# Patient Record
Sex: Female | Born: 1970 | Race: Black or African American | Hispanic: No | Marital: Single | Smoking: Never smoker
Health system: Southern US, Community
[De-identification: ages and names within clinical notes are randomized; demographics above are authoritative.]

## PROBLEM LIST (undated history)

## (undated) DIAGNOSIS — Q891 Congenital malformations of adrenal gland: Secondary | ICD-10-CM

## (undated) DIAGNOSIS — K279 Peptic ulcer, site unspecified, unspecified as acute or chronic, without hemorrhage or perforation: Secondary | ICD-10-CM

## (undated) HISTORY — PX: VAGINA RECONSTRUCTION SURGERY: SHX828

## (undated) HISTORY — PX: TONSILLECTOMY: SUR1361

---

## 1998-09-14 ENCOUNTER — Emergency Department (HOSPITAL_COMMUNITY): Admission: EM | Admit: 1998-09-14 | Discharge: 1998-09-14 | Payer: Self-pay | Admitting: Emergency Medicine

## 2008-08-19 ENCOUNTER — Emergency Department (HOSPITAL_BASED_OUTPATIENT_CLINIC_OR_DEPARTMENT_OTHER): Admission: EM | Admit: 2008-08-19 | Discharge: 2008-08-19 | Payer: Self-pay | Admitting: Emergency Medicine

## 2009-08-01 ENCOUNTER — Emergency Department (HOSPITAL_COMMUNITY): Admission: EM | Admit: 2009-08-01 | Discharge: 2009-08-01 | Payer: Self-pay | Admitting: Emergency Medicine

## 2010-08-07 LAB — DIFFERENTIAL
Basophils Absolute: 0 10*3/uL (ref 0.0–0.1)
Basophils Relative: 0 % (ref 0–1)

## 2010-08-07 LAB — CBC
HCT: 52.9 % — ABNORMAL HIGH (ref 36.0–46.0)
Hemoglobin: 17.7 g/dL — ABNORMAL HIGH (ref 12.0–15.0)
MCHC: 33.5 g/dL (ref 30.0–36.0)
MCV: 91.8 fL (ref 78.0–100.0)
RBC: 5.76 MIL/uL — ABNORMAL HIGH (ref 3.87–5.11)
RDW: 12.9 % (ref 11.5–15.5)
WBC: 11.3 10*3/uL — ABNORMAL HIGH (ref 4.0–10.5)

## 2010-08-07 LAB — URINALYSIS, ROUTINE W REFLEX MICROSCOPIC
Ketones, ur: 15 mg/dL — AB
Protein, ur: 100 mg/dL — AB

## 2010-08-07 LAB — POCT I-STAT, CHEM 8
BUN: 15 mg/dL (ref 6–23)
Calcium, Ion: 1.16 mmol/L (ref 1.12–1.32)
Chloride: 101 mEq/L (ref 96–112)
Creatinine, Ser: 1.9 mg/dL — ABNORMAL HIGH (ref 0.4–1.2)
Hemoglobin: 19 g/dL — ABNORMAL HIGH (ref 12.0–15.0)
TCO2: 25 mmol/L (ref 0–100)

## 2010-08-07 LAB — URINE CULTURE: Colony Count: >=100000

## 2010-08-07 LAB — URINE MICROSCOPIC-ADD ON

## 2010-08-23 LAB — URINE MICROSCOPIC-ADD ON

## 2010-08-23 LAB — URINALYSIS, ROUTINE W REFLEX MICROSCOPIC
Glucose, UA: NEGATIVE mg/dL
Hgb urine dipstick: NEGATIVE
Protein, ur: 30 mg/dL — AB

## 2010-08-23 LAB — CBC
Hemoglobin: 16.9 g/dL — ABNORMAL HIGH (ref 12.0–15.0)
RDW: 12.9 % (ref 11.5–15.5)

## 2010-08-23 LAB — DIFFERENTIAL
Monocytes Relative: 13 % — ABNORMAL HIGH (ref 3–12)
Neutro Abs: 2.8 10*3/uL (ref 1.7–7.7)
Neutrophils Relative %: 49 % (ref 43–77)

## 2010-08-23 LAB — COMPREHENSIVE METABOLIC PANEL
Albumin: 4.4 g/dL (ref 3.5–5.2)
Alkaline Phosphatase: 48 U/L (ref 39–117)
BUN: 14 mg/dL (ref 6–23)
CO2: 28 mEq/L (ref 19–32)
Calcium: 9.3 mg/dL (ref 8.4–10.5)
GFR calc non Af Amer: 57 mL/min — ABNORMAL LOW (ref >60)
Potassium: 3.8 mEq/L (ref 3.5–5.1)
Total Bilirubin: 0.7 mg/dL (ref 0.3–1.2)

## 2010-08-23 LAB — LIPASE, BLOOD: Lipase: 186 U/L (ref 23–300)

## 2013-08-13 ENCOUNTER — Emergency Department (HOSPITAL_BASED_OUTPATIENT_CLINIC_OR_DEPARTMENT_OTHER)
Admission: EM | Admit: 2013-08-13 | Discharge: 2013-08-13 | Disposition: A | Discharge status: Home / Self Care | Payer: Self-pay | Attending: Emergency Medicine | Admitting: Emergency Medicine

## 2013-08-13 ENCOUNTER — Encounter (HOSPITAL_BASED_OUTPATIENT_CLINIC_OR_DEPARTMENT_OTHER): Payer: Self-pay | Admitting: Emergency Medicine

## 2013-08-13 ENCOUNTER — Emergency Department (HOSPITAL_BASED_OUTPATIENT_CLINIC_OR_DEPARTMENT_OTHER): Payer: Self-pay

## 2013-08-13 DIAGNOSIS — Z79899 Other long term (current) drug therapy: Secondary | ICD-10-CM | POA: Insufficient documentation

## 2013-08-13 DIAGNOSIS — Q891 Congenital malformations of adrenal gland: Secondary | ICD-10-CM | POA: Insufficient documentation

## 2013-08-13 DIAGNOSIS — R05 Cough: Secondary | ICD-10-CM

## 2013-08-13 DIAGNOSIS — L538 Other specified erythematous conditions: Secondary | ICD-10-CM | POA: Insufficient documentation

## 2013-08-13 DIAGNOSIS — R197 Diarrhea, unspecified: Secondary | ICD-10-CM | POA: Insufficient documentation

## 2013-08-13 DIAGNOSIS — R059 Cough, unspecified: Secondary | ICD-10-CM | POA: Insufficient documentation

## 2013-08-13 DIAGNOSIS — R1110 Vomiting, unspecified: Secondary | ICD-10-CM | POA: Insufficient documentation

## 2013-08-13 DIAGNOSIS — R079 Chest pain, unspecified: Secondary | ICD-10-CM | POA: Insufficient documentation

## 2013-08-13 HISTORY — DX: Congenital malformations of adrenal gland: Q89.1

## 2013-08-13 LAB — CBC WITH DIFFERENTIAL/PLATELET
Basophils Absolute: 0 10*3/uL (ref 0.0–0.1)
Basophils Relative: 0 % (ref 0–1)
Eosinophils Absolute: 0.3 10*3/uL (ref 0.0–0.7)
Eosinophils Relative: 5 % (ref 0–5)
HEMATOCRIT: 48.2 % — AB (ref 36.0–46.0)
Hemoglobin: 17 g/dL — ABNORMAL HIGH (ref 12.0–15.0)
LYMPHS ABS: 2.5 10*3/uL (ref 0.7–4.0)
LYMPHS PCT: 37 % (ref 12–46)
MCH: 30.7 pg (ref 26.0–34.0)
MCHC: 35.3 g/dL (ref 30.0–36.0)
MCV: 87.2 fL (ref 78.0–100.0)
MONO ABS: 0.7 10*3/uL (ref 0.1–1.0)
Monocytes Relative: 11 % (ref 3–12)
NEUTROS ABS: 3.2 10*3/uL (ref 1.7–7.7)
NEUTROS PCT: 48 % (ref 43–77)
PLATELETS: 231 10*3/uL (ref 150–400)
RBC: 5.53 MIL/uL — ABNORMAL HIGH (ref 3.87–5.11)
RDW: 13.1 % (ref 11.5–15.5)
WBC: 6.7 10*3/uL (ref 4.0–10.5)

## 2013-08-13 LAB — BASIC METABOLIC PANEL
BUN: 9 mg/dL (ref 6–23)
CALCIUM: 9.9 mg/dL (ref 8.4–10.5)
CHLORIDE: 103 meq/L (ref 96–112)
CO2: 27 meq/L (ref 19–32)
CREATININE: 1.1 mg/dL (ref 0.50–1.10)
GFR calc Af Amer: 72 mL/min — ABNORMAL LOW (ref >90)
GFR calc non Af Amer: 62 mL/min — ABNORMAL LOW (ref >90)
GLUCOSE: 86 mg/dL (ref 70–99)
Potassium: 4.4 mEq/L (ref 3.7–5.3)
Sodium: 143 mEq/L (ref 137–147)

## 2013-08-13 LAB — TROPONIN I: Troponin I: <0.3 ng/mL (ref <0.30)

## 2013-08-13 MED ORDER — HYDROCODONE-HOMATROPINE 5-1.5 MG/5ML PO SYRP: 5.0000 mL | ORAL_SOLUTION | Freq: Four times a day (QID) | ORAL | Status: DC | PRN

## 2013-08-13 NOTE — ED Notes (Signed)
Pt reports onset of cold symptoms 3 days ago, diarrhea and vomiting on Tuesday.  States she has constant sharp left chest wall pain x 2 weeks.

## 2013-08-13 NOTE — Discharge Instructions (Signed)

## 2013-08-13 NOTE — ED Provider Notes (Signed)
CSN: [id number]     Arrival date & time 08/13/13  1137 History   First MD Initiated Contact with Patient 08/13/13 1204     Chief Complaint  Patient presents with  . URI     (Consider location/radiation/quality/duration/timing/severity/associated sxs/prior Treatment) HPI Comments: Pt states that she has had left sided cp without radiation. Symptoms worse with coughing. They have been going on for 2 weeks. Diarrhea and vomiting for 2 days  Patient is a 43 y.o. female presenting with URI. The history is provided by the patient. No language interpreter was used.  URI Presenting symptoms: congestion and cough   Presenting symptoms: no fever   Severity:  Moderate Onset quality:  Gradual Progression:  Unchanged Relieved by:  Nothing Worsened by:  Nothing tried Ineffective treatments:  None tried   Past Medical History  Diagnosis Date  . Congenital adrenal hypoplasia syndrome    Past Surgical History  Procedure Laterality Date  . Vagina reconstruction surgery    . Tonsillectomy     No family history on file. History  Substance Use Topics  . Smoking status: Never Smoker   . Smokeless tobacco: Not on file  . Alcohol Use: No   OB History   Grav Para Term Preterm Abortions TAB SAB Ect Mult Living                 Review of Systems  Constitutional: Negative for fever.  HENT: Positive for congestion.   Respiratory: Positive for cough.       Allergies  Sulfa antibiotics  Home Medications   Current Outpatient Rx  Name  Route  Sig  Dispense  Refill  . Cranberry 1000 MG CAPS   Oral   Take by mouth.         . Cyanocobalamin (VITAMIN B 12 PO)   Oral   Take by mouth.         . Multiple Vitamin (MULTIVITAMIN) capsule   Oral   Take 1 capsule by mouth daily.         . predniSONE (STERAPRED UNI-PAK) 5 MG TABS tablet   Oral   Take by mouth 2 (two) times daily.         . vitamin C (ASCORBIC ACID) 250 MG tablet   Oral   Take 250 mg by mouth daily.         Marland Kitchen.  VITAMIN D, ERGOCALCIFEROL, PO   Oral   Take by mouth.          BP 144/88  Pulse 110  Temp(Src) 98.5 F (36.9 C) (Oral)  Resp 18  Ht 5\' 3"  (1.6 m)  Wt 219 lb (99.338 kg)  BMI 38.80 kg/m2  SpO2 94% Physical Exam  Nursing note and vitals reviewed. Constitutional: She is oriented to person, place, and time. She appears well-developed and well-nourished.  HENT:  Head: Normocephalic and atraumatic.  Right Ear: External ear normal.  Left Ear: External ear normal.  Nose: Rhinorrhea present.  Mouth/Throat: Posterior oropharyngeal erythema present.  Cardiovascular: Normal rate and regular rhythm.   Pulmonary/Chest: Effort normal and breath sounds normal.  Abdominal: Soft. Bowel sounds are normal. There is no tenderness.  Musculoskeletal: Normal range of motion.  Neurological: She is alert and oriented to person, place, and time.  Skin: Skin is warm and dry.  Psychiatric: She has a normal mood and affect.    ED Course  Procedures (including critical care time) Labs Review Labs Reviewed  CBC WITH DIFFERENTIAL - Abnormal; Notable for the following:  RBC 5.53 (*)    Hemoglobin 17.0 (*)    HCT 48.2 (*)    All other components within normal limits  BASIC METABOLIC PANEL - Abnormal; Notable for the following:    GFR calc non Af Amer 62 (*)    GFR calc Af Amer 72 (*)    All other components within normal limits  TROPONIN I   Imaging Review Dg Chest 2 View  08/13/2013   CLINICAL DATA:  Cough and congestion.  EXAM: CHEST  2 VIEW  COMPARISON:  08/01/2009  FINDINGS: The heart size and mediastinal contours are within normal limits. Both lungs are clear. The visualized skeletal structures are unremarkable.  IMPRESSION: No active cardiopulmonary disease.   Electronically Signed   By: Amie Portland M.D.   On: 08/13/2013 12:21     EKG Interpretation   Date/Time:  Thursday August 13 2013 11:57:36 EDT Ventricular Rate:  97 PR Interval:  154 QRS Duration: 72 QT Interval:  320 QTC  Calculation: 406 R Axis:   32 Text Interpretation:  Normal sinus rhythm Low voltage QRS Nonspecific T  wave abnormality Abnormal ECG Confirmed by Fayrene Fearing  MD, MARK ([id number]) on  08/13/2013 12:06:01 PM      MDM   Final diagnoses:  Cough  Chest pain    No infection noted on the chest. Pt is okay to follow up with pcp for continued symptoms. Will given hydromet for pain and cough   Teressa Lower, NP 08/13/13 1404

## 2013-08-14 NOTE — ED Provider Notes (Signed)
Medical screening examination/treatment/procedure(s) were performed by non-physician practitioner and as supervising physician I was immediately available for consultation/collaboration.   EKG Interpretation   Date/Time:  Thursday August 13 2013 11:57:36 EDT Ventricular Rate:  97 PR Interval:  154 QRS Duration: 72 QT Interval:  320 QTC Calculation: 406 R Axis:   32 Text Interpretation:  Normal sinus rhythm Low voltage QRS Nonspecific T  wave abnormality Abnormal ECG Confirmed by Fayrene FearingJAMES  MD, Tyr Franca ([id number]) on  08/13/2013 12:06:01 PM        Rolland PorterMark Atonya Templer, MD 08/14/13 [phone]

## 2014-02-03 ENCOUNTER — Other Ambulatory Visit: Payer: Self-pay

## 2014-02-03 DIAGNOSIS — Z1231 Encounter for screening mammogram for malignant neoplasm of breast: Secondary | ICD-10-CM

## 2014-02-19 ENCOUNTER — Ambulatory Visit: Payer: Self-pay

## 2014-02-23 ENCOUNTER — Ambulatory Visit
Admission: RE | Admit: 2014-02-23 | Discharge: 2014-02-23 | Disposition: A | Discharge status: Home / Self Care | Payer: Self-pay | Source: Ambulatory Visit

## 2014-02-23 ENCOUNTER — Encounter (INDEPENDENT_AMBULATORY_CARE_PROVIDER_SITE_OTHER): Payer: Self-pay

## 2014-02-23 DIAGNOSIS — Z1231 Encounter for screening mammogram for malignant neoplasm of breast: Secondary | ICD-10-CM

## 2015-06-28 ENCOUNTER — Other Ambulatory Visit: Payer: Self-pay

## 2015-06-28 DIAGNOSIS — Z1231 Encounter for screening mammogram for malignant neoplasm of breast: Secondary | ICD-10-CM

## 2015-08-02 ENCOUNTER — Ambulatory Visit: Payer: Self-pay

## 2015-08-23 ENCOUNTER — Ambulatory Visit
Admission: RE | Admit: 2015-08-23 | Discharge: 2015-08-23 | Disposition: A | Discharge status: Home / Self Care | Payer: BLUE CROSS/BLUE SHIELD | Source: Ambulatory Visit

## 2015-08-23 DIAGNOSIS — Z1231 Encounter for screening mammogram for malignant neoplasm of breast: Secondary | ICD-10-CM

## 2015-08-24 ENCOUNTER — Other Ambulatory Visit: Payer: Self-pay | Admitting: Internal Medicine

## 2015-08-24 DIAGNOSIS — N644 Mastodynia: Secondary | ICD-10-CM

## 2015-08-24 DIAGNOSIS — N63 Unspecified lump in unspecified breast: Secondary | ICD-10-CM

## 2015-08-29 ENCOUNTER — Ambulatory Visit
Admission: RE | Admit: 2015-08-29 | Discharge: 2015-08-29 | Disposition: A | Discharge status: Home / Self Care | Payer: BLUE CROSS/BLUE SHIELD | Source: Ambulatory Visit | Attending: Internal Medicine | Admitting: Internal Medicine

## 2015-08-29 ENCOUNTER — Other Ambulatory Visit: Payer: Self-pay | Admitting: Internal Medicine

## 2015-08-29 ENCOUNTER — Ambulatory Visit: Payer: BLUE CROSS/BLUE SHIELD

## 2015-08-29 DIAGNOSIS — N63 Unspecified lump in unspecified breast: Secondary | ICD-10-CM

## 2015-08-29 DIAGNOSIS — N644 Mastodynia: Secondary | ICD-10-CM

## 2015-09-21 ENCOUNTER — Other Ambulatory Visit: Payer: BLUE CROSS/BLUE SHIELD

## 2015-11-09 ENCOUNTER — Encounter (HOSPITAL_BASED_OUTPATIENT_CLINIC_OR_DEPARTMENT_OTHER): Payer: Self-pay | Admitting: *Deleted

## 2015-11-09 ENCOUNTER — Emergency Department (HOSPITAL_BASED_OUTPATIENT_CLINIC_OR_DEPARTMENT_OTHER)
Admission: EM | Admit: 2015-11-09 | Discharge: 2015-11-09 | Disposition: A | Discharge status: Home / Self Care | Payer: BLUE CROSS/BLUE SHIELD | Attending: Emergency Medicine | Admitting: Emergency Medicine

## 2015-11-09 ENCOUNTER — Emergency Department (HOSPITAL_BASED_OUTPATIENT_CLINIC_OR_DEPARTMENT_OTHER): Payer: BLUE CROSS/BLUE SHIELD

## 2015-11-09 DIAGNOSIS — K319 Disease of stomach and duodenum, unspecified: Secondary | ICD-10-CM

## 2015-11-09 DIAGNOSIS — R1013 Epigastric pain: Secondary | ICD-10-CM | POA: Diagnosis present

## 2015-11-09 DIAGNOSIS — N390 Urinary tract infection, site not specified: Secondary | ICD-10-CM

## 2015-11-09 DIAGNOSIS — K30 Functional dyspepsia: Secondary | ICD-10-CM

## 2015-11-09 DIAGNOSIS — K279 Peptic ulcer, site unspecified, unspecified as acute or chronic, without hemorrhage or perforation: Secondary | ICD-10-CM | POA: Diagnosis not present

## 2015-11-09 LAB — CBC WITH DIFFERENTIAL/PLATELET
Basophils Absolute: 0 10*3/uL (ref 0.0–0.1)
Basophils Relative: 0 %
Eosinophils Absolute: 0.2 10*3/uL (ref 0.0–0.7)
Eosinophils Relative: 3 %
HCT: 45.8 % (ref 36.0–46.0)
Hemoglobin: 16.2 g/dL — ABNORMAL HIGH (ref 12.0–15.0)
Lymphocytes Relative: 37 %
Lymphs Abs: 2.5 10*3/uL (ref 0.7–4.0)
MCH: 31 pg (ref 26.0–34.0)
MCHC: 35.4 g/dL (ref 30.0–36.0)
MCV: 87.6 fL (ref 78.0–100.0)
Monocytes Absolute: 0.6 10*3/uL (ref 0.1–1.0)
Monocytes Relative: 8 %
Neutro Abs: 3.5 10*3/uL (ref 1.7–7.7)
Neutrophils Relative %: 52 %
Platelets: 234 10*3/uL (ref 150–400)
RBC: 5.23 MIL/uL — ABNORMAL HIGH (ref 3.87–5.11)
RDW: 13.4 % (ref 11.5–15.5)
WBC: 6.8 10*3/uL (ref 4.0–10.5)

## 2015-11-09 LAB — URINALYSIS, ROUTINE W REFLEX MICROSCOPIC
BILIRUBIN URINE: NEGATIVE
GLUCOSE, UA: NEGATIVE mg/dL
Ketones, ur: NEGATIVE mg/dL
Nitrite: NEGATIVE
Protein, ur: 100 mg/dL — AB
SPECIFIC GRAVITY, URINE: 1.016 (ref 1.005–1.030)
pH: 6 (ref 5.0–8.0)

## 2015-11-09 LAB — URINE MICROSCOPIC-ADD ON

## 2015-11-09 LAB — LIPASE, BLOOD: Lipase: 43 U/L (ref 11–51)

## 2015-11-09 LAB — COMPREHENSIVE METABOLIC PANEL
ALT: 35 U/L (ref 14–54)
AST: 29 U/L (ref 15–41)
Albumin: 3.8 g/dL (ref 3.5–5.0)
Alkaline Phosphatase: 47 U/L (ref 38–126)
Anion gap: 6 (ref 5–15)
BUN: 10 mg/dL (ref 6–20)
CO2: 26 mmol/L (ref 22–32)
Calcium: 9 mg/dL (ref 8.9–10.3)
Chloride: 106 mmol/L (ref 101–111)
Creatinine, Ser: 1.25 mg/dL — ABNORMAL HIGH (ref 0.44–1.00)
GFR calc Af Amer: >60 mL/min (ref >60)
GFR calc non Af Amer: 52 mL/min — ABNORMAL LOW (ref >60)
Glucose, Bld: 108 mg/dL — ABNORMAL HIGH (ref 65–99)
Potassium: 3.6 mmol/L (ref 3.5–5.1)
Sodium: 138 mmol/L (ref 135–145)
Total Bilirubin: 0.6 mg/dL (ref 0.3–1.2)
Total Protein: 6.8 g/dL (ref 6.5–8.1)

## 2015-11-09 LAB — PREGNANCY, URINE: PREG TEST UR: NEGATIVE

## 2015-11-09 MED ORDER — SUCRALFATE 1 G PO TABS: 1.0000 g | ORAL_TABLET | Freq: Three times a day (TID) | ORAL | Status: DC

## 2015-11-09 MED ORDER — GI COCKTAIL ~~LOC~~
30.0000 mL | Freq: Once | ORAL | Status: AC
  Administered 2015-11-09: 30 mL via ORAL
  Filled 2015-11-09: qty 30

## 2015-11-09 MED ORDER — IOPAMIDOL (ISOVUE-300) INJECTION 61%
100.0000 mL | Freq: Once | INTRAVENOUS | Status: AC | PRN
  Administered 2015-11-09: 100 mL via INTRAVENOUS

## 2015-11-09 MED ORDER — NITROFURANTOIN MONOHYD MACRO 100 MG PO CAPS: 100.0000 mg | ORAL_CAPSULE | Freq: Two times a day (BID) | ORAL | Status: DC

## 2015-11-09 MED FILL — SUCRALFATE 1 GM TABLET: 1 | 22 days supply | Qty: 90 | Fill #0

## 2015-11-09 MED FILL — NITROFURANTOIN MONO-MCR 100: 100 | 5 days supply | Qty: 10 | Fill #0

## 2015-11-09 NOTE — ED Notes (Signed)
Pt states she has had abd pain since March, but it has gradually gotten worse. +diarrhea last pm

## 2015-11-09 NOTE — ED Provider Notes (Signed)
CSN: [id number]     Arrival date & time 11/09/15  0914 History   First MD Initiated Contact with Patient 11/09/15 [phone]     Chief Complaint  Patient presents with  . Abdominal Pain    HPI   45 year old female presents today with complaints of abdominal pain. Patient reports symptoms started approximately 4 years ago. She reports a sharp burning pain in her epigastric region. She reports the pain is worse after food, but has become present even without food. She reports a history of acid reflux, slightly worsened recently. Patient denies any fever, weight loss, nausea, vomiting, lower abdominal pain, dark or tarry stools, diarrhea. Patient reports increased urinary frequency. No history of abdominal surgeries.   Past Medical History  Diagnosis Date  . Congenital adrenal hypoplasia syndrome    Past Surgical History  Procedure Laterality Date  . Vagina reconstruction surgery    . Tonsillectomy     No family history on file. Social History  Substance Use Topics  . Smoking status: Never Smoker   . Smokeless tobacco: None  . Alcohol Use: No   OB History    No data available     Review of Systems  All other systems reviewed and are negative.     Allergies  Sulfa antibiotics  Home Medications   Prior to Admission medications   Medication Sig Start Date End Date Taking? Authorizing Provider  Cranberry 1000 MG CAPS Take by mouth.    Historical Provider, MD  Cyanocobalamin (VITAMIN B 12 PO) Take by mouth.    Historical Provider, MD  HYDROcodone-homatropine (HYDROMET) 5-1.5 MG/5ML syrup Take 5 mLs by mouth every 6 (six) hours as needed for cough. 08/13/13   Teressa Lower, NP  Multiple Vitamin (MULTIVITAMIN) capsule Take 1 capsule by mouth daily.    Historical Provider, MD  nitrofurantoin, macrocrystal-monohydrate, (MACROBID) 100 MG capsule Take 1 capsule (100 mg total) by mouth 2 (two) times daily. 11/09/15   Eyvonne Mechanic, PA-C  predniSONE (STERAPRED UNI-PAK) 5 MG TABS tablet  Take by mouth 2 (two) times daily.    Historical Provider, MD  sucralfate (CARAFATE) 1 g tablet Take 1 tablet (1 g total) by mouth 4 (four) times daily -  with meals and at bedtime. 11/09/15   Eyvonne Mechanic, PA-C  vitamin C (ASCORBIC ACID) 250 MG tablet Take 250 mg by mouth daily.    Historical Provider, MD  VITAMIN D, ERGOCALCIFEROL, PO Take by mouth.    Historical Provider, MD   BP 128/88 mmHg  Pulse 104  Temp(Src) 98.8 F (37.1 C) (Oral)  Resp 20  Ht  (1.6 m)  Wt 102.059 kg  BMI 39.87 kg/m2  SpO2 98%  LMP  Physical Exam  Constitutional: She is oriented to person, place, and time. She appears well-developed and well-nourished.  HENT:  Head: Normocephalic and atraumatic.  Eyes: Conjunctivae are normal. Pupils are equal, round, and reactive to light. Right eye exhibits no discharge. Left eye exhibits no discharge. No scleral icterus.  Neck: Normal range of motion. No JVD present. No tracheal deviation present.  Pulmonary/Chest: Effort normal. No stridor. No respiratory distress. She has no wheezes. She has no rales. She exhibits no tenderness.  Abdominal: Soft. She exhibits no distension and no mass. There is tenderness. There is no rebound and no guarding.  Tenderness to palpation of the epigastric region, no signs of hernia, no redness, warmth to touch  Neurological: She is alert and oriented to person, place, and time. Coordination normal.  Psychiatric: She  has a normal mood and affect. Her behavior is normal. Judgment and thought content normal.  Nursing note and vitals reviewed.   ED Course  Procedures (including critical care time) Labs Review Labs Reviewed  URINALYSIS, ROUTINE W REFLEX MICROSCOPIC (NOT AT [facility]) - Abnormal; Notable for the following:    APPearance CLOUDY (*)    Hgb urine dipstick TRACE (*)    Protein, ur 100 (*)    Leukocytes, UA MODERATE (*)    All other components within normal limits  URINE MICROSCOPIC-ADD ON - Abnormal; Notable for the following:     Squamous Epithelial / LPF 0-5 (*)    Bacteria, UA MANY (*)    All other components within normal limits  CBC WITH DIFFERENTIAL/PLATELET - Abnormal; Notable for the following:    RBC 5.23 (*)    Hemoglobin 16.2 (*)    All other components within normal limits  COMPREHENSIVE METABOLIC PANEL - Abnormal; Notable for the following:    Glucose, Bld 108 (*)    Creatinine, Ser 1.25 (*)    GFR calc non Af Amer 52 (*)    All other components within normal limits  PREGNANCY, URINE  LIPASE, BLOOD    Imaging Review Ct Abdomen Pelvis W Contrast  11/09/2015  CLINICAL DATA:  Epigastric abdominal pain, nausea and diarrhea off and on for a few months, history congenital adrenal hypoplasia syndrome EXAM: CT ABDOMEN AND PELVIS WITH CONTRAST TECHNIQUE: Multidetector CT imaging of the abdomen and pelvis was performed using the standard protocol following bolus administration of intravenous contrast. Sagittal and coronal MPR images reconstructed from axial data set. CONTRAST:  100mL ISOVUE-300 IOPAMIDOL (ISOVUE-300) INJECTION 61% IV. Dilute oral contrast. COMPARISON:  01/19/2015 FINDINGS: Lower chest:  Lung bases clear Hepatobiliary: Liver and gallbladder normal appearance Pancreas: Normal appearance Spleen: Normal appearance Adrenals/Urinary Tract: Diffuse nodular thickening of enlarged adrenal glands unchanged. Small LEFT renal cyst. Kidneys otherwise normal appearance. No hydronephrosis hydroureter, or urinary tract calcification. Bladder decompressed. Stomach/Bowel: Normal appendix. Question small hiatal hernia. Stomach and bowel loops otherwise normal appearance. Vascular/Lymphatic: Vascular structures grossly patent. Scattered pelvic phleboliths. No adenopathy. Reproductive: Uterus prominent size, little changed, cannot exclude leiomyoma at upper uterine segment. Unremarkable adnexa. Other: No free air, free fluid, inflammatory process or hernia. Musculoskeletal: No acute abnormalities IMPRESSION: Nodular  appearing enlarged adrenal glands unchanged. Question uterine leiomyoma at upper uterine segment. No definite acute intra-abdominal or intrapelvic abnormalities. Electronically Signed   By: Ulyses SouthwardMark  Boles M.D.   On: 11/09/2015 11:57   I have personally reviewed and evaluated these images and lab results as part of my medical decision-making.   EKG Interpretation None      MDM   Final diagnoses:  Peptic disease  UTI (lower urinary tract infection)    Labs:CBC, CMP, lipase, urinalysis  Imaging: CT abdomen pelvis with contrast  Consults:  Therapeutics:  Discharge Meds: Nitrofurantoin   Assessment/Plan:   Patient's presentation is most consistent with peptic ulcer disease. Patient was given a GI cocktail here which improved symptoms. Patient has no significant findings on CT exam that would necessitate further evaluation or management here in the ED. Patient will be instructed to increased dose of omeprazole to 40 mg daily, Carafate, and follow-up gastroenterology. Patient also noted to have urinary tract infection, she does have frequency with this, she will be treated for this. She she is given strict return precautions, verbalized understanding and agreement today's plan had no further questions or concerns the time discharge.       Eyvonne MechanicJeffrey Lexani Corona,  PA-C 11/09/15 1238  Leta BaptistEmily Roe Nguyen, MD 11/12/15 [phone]

## 2016-03-26 ENCOUNTER — Emergency Department (HOSPITAL_BASED_OUTPATIENT_CLINIC_OR_DEPARTMENT_OTHER)
Admission: EM | Admit: 2016-03-26 | Discharge: 2016-03-26 | Disposition: A | Discharge status: Home / Self Care | Payer: BLUE CROSS/BLUE SHIELD | Attending: Emergency Medicine | Admitting: Emergency Medicine

## 2016-03-26 ENCOUNTER — Emergency Department (HOSPITAL_BASED_OUTPATIENT_CLINIC_OR_DEPARTMENT_OTHER): Payer: BLUE CROSS/BLUE SHIELD

## 2016-03-26 ENCOUNTER — Encounter (HOSPITAL_BASED_OUTPATIENT_CLINIC_OR_DEPARTMENT_OTHER): Payer: Self-pay | Admitting: Emergency Medicine

## 2016-03-26 DIAGNOSIS — X58XXXA Exposure to other specified factors, initial encounter: Secondary | ICD-10-CM | POA: Diagnosis not present

## 2016-03-26 DIAGNOSIS — Z79899 Other long term (current) drug therapy: Secondary | ICD-10-CM | POA: Diagnosis not present

## 2016-03-26 DIAGNOSIS — Y939 Activity, unspecified: Secondary | ICD-10-CM | POA: Diagnosis not present

## 2016-03-26 DIAGNOSIS — Y929 Unspecified place or not applicable: Secondary | ICD-10-CM | POA: Diagnosis not present

## 2016-03-26 DIAGNOSIS — S161XXA Strain of muscle, fascia and tendon at neck level, initial encounter: Secondary | ICD-10-CM | POA: Diagnosis not present

## 2016-03-26 DIAGNOSIS — Y999 Unspecified external cause status: Secondary | ICD-10-CM | POA: Insufficient documentation

## 2016-03-26 DIAGNOSIS — M5412 Radiculopathy, cervical region: Secondary | ICD-10-CM

## 2016-03-26 DIAGNOSIS — S199XXA Unspecified injury of neck, initial encounter: Secondary | ICD-10-CM | POA: Diagnosis present

## 2016-03-26 HISTORY — DX: Peptic ulcer, site unspecified, unspecified as acute or chronic, without hemorrhage or perforation: K27.9

## 2016-03-26 MED ORDER — IBUPROFEN 800 MG PO TABS
800.0000 mg | ORAL_TABLET | Freq: Once | ORAL | Status: AC
  Administered 2016-03-26: 800 mg via ORAL
  Filled 2016-03-26: qty 1

## 2016-03-26 MED ORDER — METHOCARBAMOL 500 MG PO TABS
500.0000 mg | ORAL_TABLET | Freq: Once | ORAL | Status: AC
  Administered 2016-03-26: 500 mg via ORAL
  Filled 2016-03-26: qty 1

## 2016-03-26 MED ORDER — IBUPROFEN 600 MG PO TABS
600.0000 mg | ORAL_TABLET | Freq: Four times a day (QID) | ORAL | 0 refills | Status: AC | PRN
Start: 2016-03-26 — End: ?

## 2016-03-26 MED ORDER — METHOCARBAMOL 500 MG PO TABS
500.0000 mg | ORAL_TABLET | Freq: Two times a day (BID) | ORAL | 0 refills | Status: AC | PRN
Start: 2016-03-26 — End: ?

## 2016-03-26 NOTE — ED Triage Notes (Signed)
Pt having left sided neck pain with left arm numbness since Friday.  No sob, no N/V, some dizziness and lightheadedness.  Some left sided weakness noted in arm.

## 2016-03-26 NOTE — Discharge Instructions (Signed)
Ibuprofen for pain. Robaxin is your muscle relaxer to take as needed. Ice affected area for additional pain relief. Follow up with sports medicine physician if no improvement in one week. Return to ER for new or worsening symptoms, any additional concerns.   COLD THERAPY DIRECTIONS:  Ice or gel packs can be used to reduce both pain and swelling. Ice is the most helpful within the first 24 to 48 hours after an injury or flareup from overusing a muscle or joint.  Ice is effective, has very few side effects, and is safe for most people to use.   If you expose your skin to cold temperatures for too long or without the proper protection, you can damage your skin or nerves. Watch for signs of skin damage due to cold.   HOME CARE INSTRUCTIONS  Follow these tips to use ice and cold packs safely.  Place a dry or damp towel between the ice and skin. A damp towel will cool the skin more quickly, so you may need to shorten the time that the ice is used.  For a more rapid response, add gentle compression to the ice.  Ice for no more than 10 to 20 minutes at a time. The bonier the area you are icing, the less time it will take to get the benefits of ice.  Check your skin after 5 minutes to make sure there are no signs of a poor response to cold or skin damage.  Rest 20 minutes or more in between uses.  Once your skin is numb, you can end your treatment. You can test numbness by very lightly touching your skin. The touch should be so light that you do not see the skin dimple from the pressure of your fingertip. When using ice, most people will feel these normal sensations in this order: cold, burning, aching, and numbness.

## 2016-03-26 NOTE — ED Provider Notes (Signed)
[facility] Provider Note   CSN: [id number] Arrival date & time: 03/26/16  1057     History   Chief Complaint Chief Complaint  Patient presents with  . Neck Pain  . Numbness    HPI Alexis Gonzalez is a 45 y.o. female.  The history is provided by the patient and medical records. No language interpreter was used.  Neck Pain   Associated symptoms include numbness. Pertinent negatives include no headaches and no weakness.   Alexis Gonzalez is a right hand dominant 45 y.o. female  with a PMH of congenital adrenal hypoplasia syndrome, PUD who presents to the Emergency Department complaining of worsening constant left sided neck pain and shoulder pain x 3 days. Associated symptoms include left arm tingling/numbness, worst when she awakens in the mornings. No known injury or increase in activity. She has taken aspirin and ibuprofen with little relief. No chest pain, shortness of breath, diaphoresis, n/v, abdominal pain, back pain.   Past Medical History:  Diagnosis Date  . Congenital adrenal hypoplasia syndrome   . Peptic ulcer     There are no active problems to display for this patient.   Past Surgical History:  Procedure Laterality Date  . TONSILLECTOMY    . VAGINA RECONSTRUCTION SURGERY      OB History    No data available       Home Medications    Prior to Admission medications   Medication Sig Start Date End Date Taking? Authorizing Provider  omeprazole (PRILOSEC) 20 MG capsule Take 20 mg by mouth 2 (two) times daily before a meal.   Yes Historical Provider, MD  Cranberry 1000 MG CAPS Take by mouth.    Historical Provider, MD  Cyanocobalamin (VITAMIN B 12 PO) Take by mouth.    Historical Provider, MD  ibuprofen (ADVIL,MOTRIN) 600 MG tablet Take 1 tablet (600 mg total) by mouth every 6 (six) hours as needed. 03/26/16   Chase Picket Ward, PA-C  methocarbamol (ROBAXIN) 500 MG tablet Take 1 tablet (500 mg total) by mouth 2 (two) times daily as needed for  muscle spasms. 03/26/16   Chase Picket Ward, PA-C  Multiple Vitamin (MULTIVITAMIN) capsule Take 1 capsule by mouth daily.    Historical Provider, MD  predniSONE (STERAPRED UNI-PAK) 5 MG TABS tablet Take by mouth 2 (two) times daily.    Historical Provider, MD  vitamin C (ASCORBIC ACID) 250 MG tablet Take 250 mg by mouth daily.    Historical Provider, MD  VITAMIN D, ERGOCALCIFEROL, PO Take by mouth.    Historical Provider, MD    Family History No family history on file.  Social History Social History  Substance Use Topics  . Smoking status: Never Smoker  . Smokeless tobacco: Never Used  . Alcohol use No     Allergies   Sulfa antibiotics   Review of Systems Review of Systems  Constitutional: Negative for diaphoresis and fever.  HENT: Negative for congestion and trouble swallowing.   Eyes: Negative for visual disturbance.  Respiratory: Negative for cough and shortness of breath.   Cardiovascular: Negative.   Gastrointestinal: Negative for abdominal pain, nausea and vomiting.  Genitourinary: Negative for dysuria.  Musculoskeletal: Positive for neck pain. Negative for back pain.  Skin: Negative for rash and wound.  Neurological: Positive for numbness. Negative for weakness and headaches.     Physical Exam Updated Vital Signs BP 123/92 (BP Location: Right Arm)   Pulse 100   Temp 98 F (36.7 C) (Oral)   Resp 18  Ht 5\' 3"  (1.6 m)   Wt 113.4 kg   SpO2 96%   BMI 44.29 kg/m   Physical Exam  Constitutional: She is oriented to person, place, and time. She appears well-developed and well-nourished. No distress.  HENT:  Head: Normocephalic and atraumatic.  Neck:  No midline tenderness. TTP of left paraspinal musculature.   Cardiovascular: Normal rate, regular rhythm and normal heart sounds.   No murmur heard. Pulmonary/Chest: Effort normal and breath sounds normal. No respiratory distress.  Abdominal: Soft. She exhibits no distension. There is no tenderness.    Musculoskeletal:  Tenderness and swelling to left chest musculature. TTP of posterior shoulder. Full ROM. Sensation intact, although subjectively decreased sensation of LUE when compared to right. 5/5 muscle strength x 4 including grip strength and pincer grasp. 2+ radial pulse.  Neurological: She is alert and oriented to person, place, and time.  Skin: Skin is warm and dry.  Nursing note and vitals reviewed.    ED Treatments / Results  Labs (all labs ordered are listed, but only abnormal results are displayed) Labs Reviewed - No data to display  EKG  EKG Interpretation None       Radiology Dg Cervical Spine Complete  Result Date: 03/26/2016 CLINICAL DATA:  Left-sided neck pain with left arm numbness since Friday. Left arm weakness. No injury. EXAM: CERVICAL SPINE - COMPLETE 4+ VIEW COMPARISON:  None. FINDINGS: Cervical spine is visualized from the occiput to the cervicothoracic junction. There is reversal of the normal cervical lordosis. Alignment is otherwise anatomic. Vertebral body height is normal. Prevertebral soft tissues are within normal limits. Neural foramina appear grossly patent. Mild anterior endplate degenerative changes along the inferior endplates of C4 and C5. Dens is partially obscured on the dedicated view. Visualized lung apices are unremarkable. IMPRESSION: Reversal of the normal cervical lordosis with minimal degenerative change anteriorly along the inferior endplates of C4 and C5. Electronically Signed   By: Leanna BattlesMelinda  Blietz M.D.   On: 03/26/2016 12:11   Dg Shoulder Left  Result Date: 03/26/2016 CLINICAL DATA:  Left-sided neck pain with left arm numbness and weakness. No injury. EXAM: LEFT SHOULDER - 2+ VIEW COMPARISON:  None. FINDINGS: No acute osseous or joint abnormality. Degenerative changes are seen in the left acromioclavicular joint. Visualized portion of the left chest is grossly unremarkable. IMPRESSION: 1. No acute findings. 2. Left acromioclavicular  joint osteoarthritis. Electronically Signed   By: Leanna BattlesMelinda  Blietz M.D.   On: 03/26/2016 12:11    Procedures Procedures (including critical care time)  Medications Ordered in ED Medications  methocarbamol (ROBAXIN) tablet 500 mg (500 mg Oral Given 03/26/16 1205)  ibuprofen (ADVIL,MOTRIN) tablet 800 mg (800 mg Oral Given 03/26/16 1206)     Initial Impression / Assessment and Plan / ED Course  I have reviewed the triage vital signs and the nursing notes.  Pertinent labs & imaging results that were available during my care of the patient were reviewed by me and considered in my medical decision making (see chart for details).  Clinical Course    Colen DarlingChristy Zelada is a 45 y.o. female who presents to ED for left shoulder and neck pain x 3 days with associated intermittent left arm numbness/tingling. On exam, patient has 5/5 muscle strength in bilateral upper extremities. Does have subjective decreased sensation to LUE diffusely when compared to RUE. She is very tender to palpation of chest wall and left paraspinal musculature. Pain with movement of the left shoulder. X-rays of c-spine and shoulder obtained:   IMPRESSION:  Reversal of the normal cervical lordosis with minimal degenerative change anteriorly along the inferior endplates of C4 and C5. IMPRESSION: 1. No acute findings. 2. Left acromioclavicular joint osteoarthritis.  Pain controlled in ED. Sling provided. Sports medicine follow up if symptoms do not improve. 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 Symptomatic home care instructions discussed. Rx for robaxin and ibuprofen given. Reasons to return to ER were discussed and all questions answered.   Final Clinical Impressions(s) / ED Diagnoses   Final diagnoses:  Acute strain of neck muscle, initial encounter  Cervical radiculopathy    New Prescriptions Discharge Medication List as of 03/26/2016 12:46 PM    START taking these medications   Details  ibuprofen  (ADVIL,MOTRIN) 600 MG tablet Take 1 tablet (600 mg total) by mouth every 6 (six) hours as needed., Starting Mon 03/26/2016, Print    methocarbamol (ROBAXIN) 500 MG tablet Take 1 tablet (500 mg total) by mouth 2 (two) times daily as needed for muscle spasms., Starting Mon 03/26/2016, Print         [facility] GroupJaime Pilcher Ward, PA-C 03/26/16 1305    Shaune Pollackameron Isaacs, MD 03/26/16 2023

## 2016-11-21 ENCOUNTER — Other Ambulatory Visit: Payer: Self-pay | Admitting: Internal Medicine

## 2016-11-21 DIAGNOSIS — Z1231 Encounter for screening mammogram for malignant neoplasm of breast: Secondary | ICD-10-CM

## 2016-11-28 ENCOUNTER — Ambulatory Visit
Admission: RE | Admit: 2016-11-28 | Discharge: 2016-11-28 | Disposition: A | Discharge status: Home / Self Care | Payer: BLUE CROSS/BLUE SHIELD | Source: Ambulatory Visit | Attending: Internal Medicine | Admitting: Internal Medicine

## 2016-11-28 ENCOUNTER — Ambulatory Visit: Payer: BLUE CROSS/BLUE SHIELD

## 2016-11-28 DIAGNOSIS — Z1231 Encounter for screening mammogram for malignant neoplasm of breast: Secondary | ICD-10-CM

## 2017-07-24 IMAGING — MG DIGITAL SCREENING BILATERAL MAMMOGRAM WITH CAD
4 series · 4 of 4 positions shown · non-contrast
Comparison: Previous exam(s).

CLINICAL DATA: Screening.

EXAM:
DIGITAL SCREENING BILATERAL MAMMOGRAM WITH CAD

[R MLO]
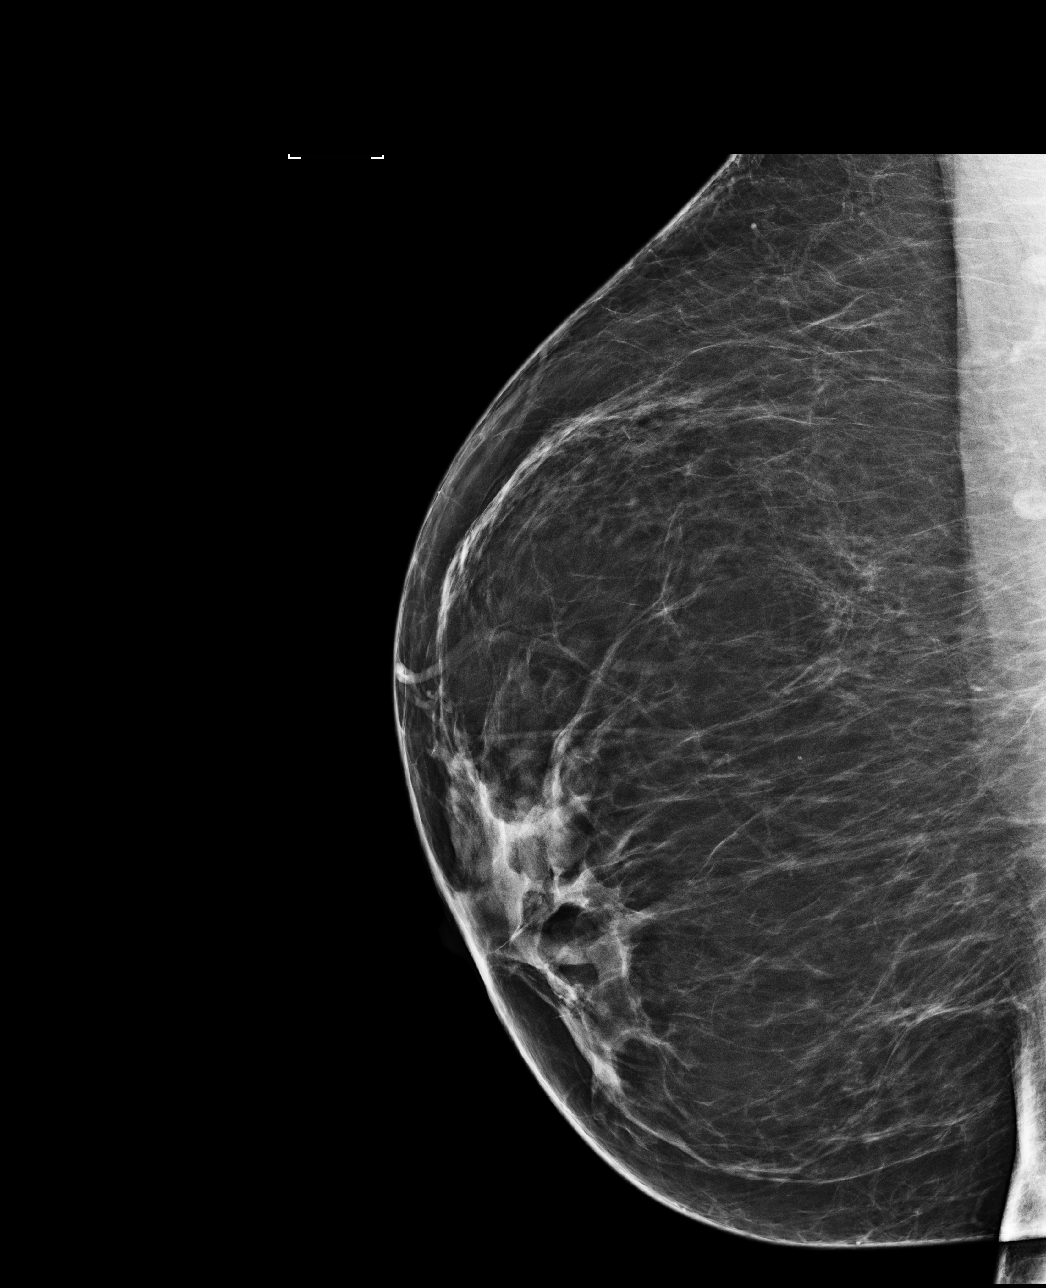

[R CC]
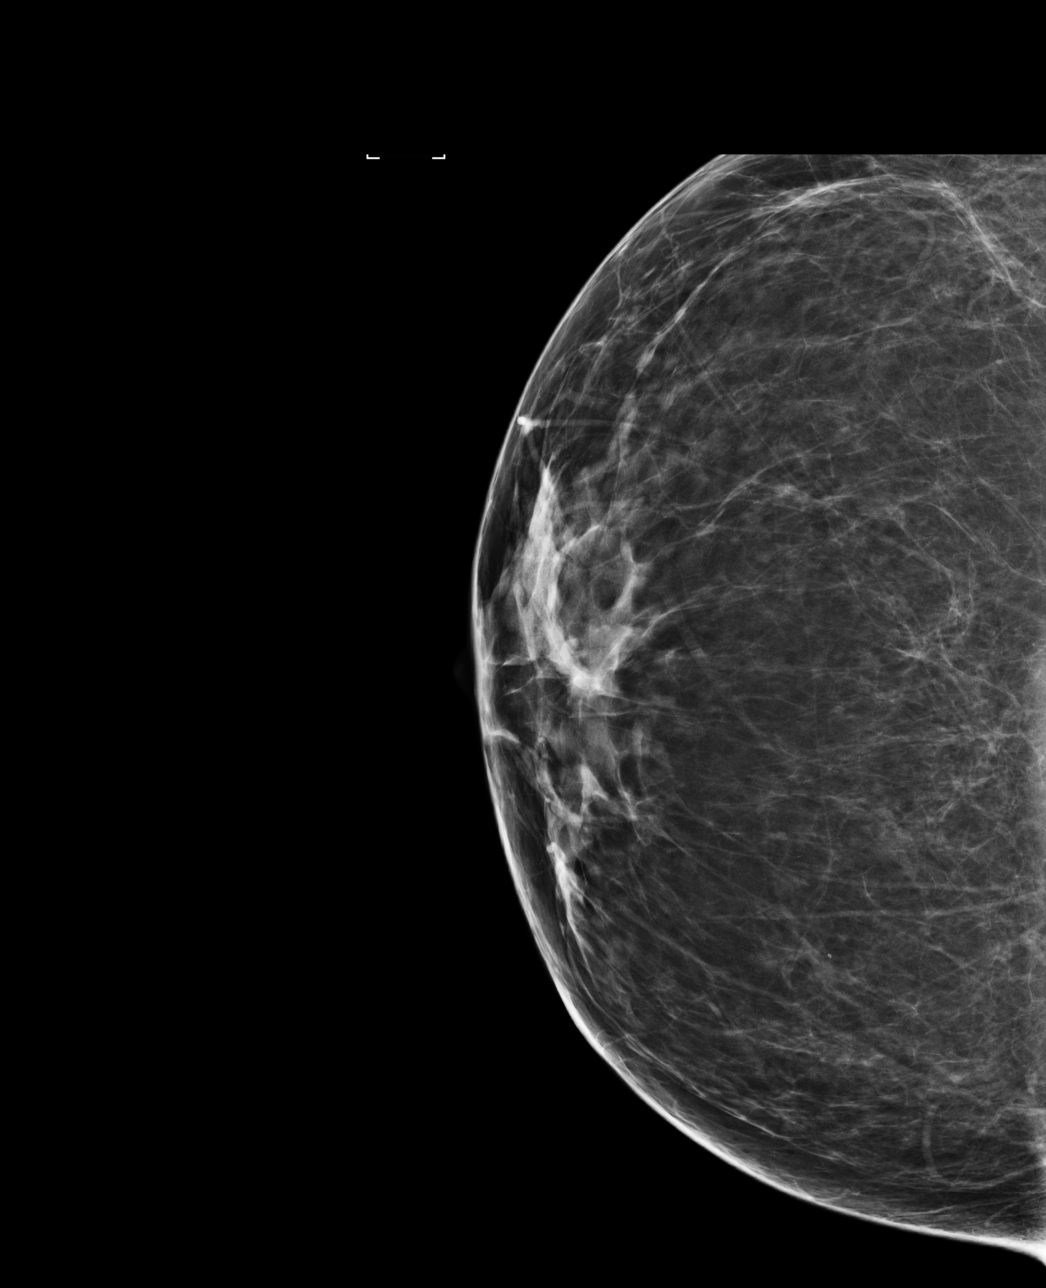

[L CC]
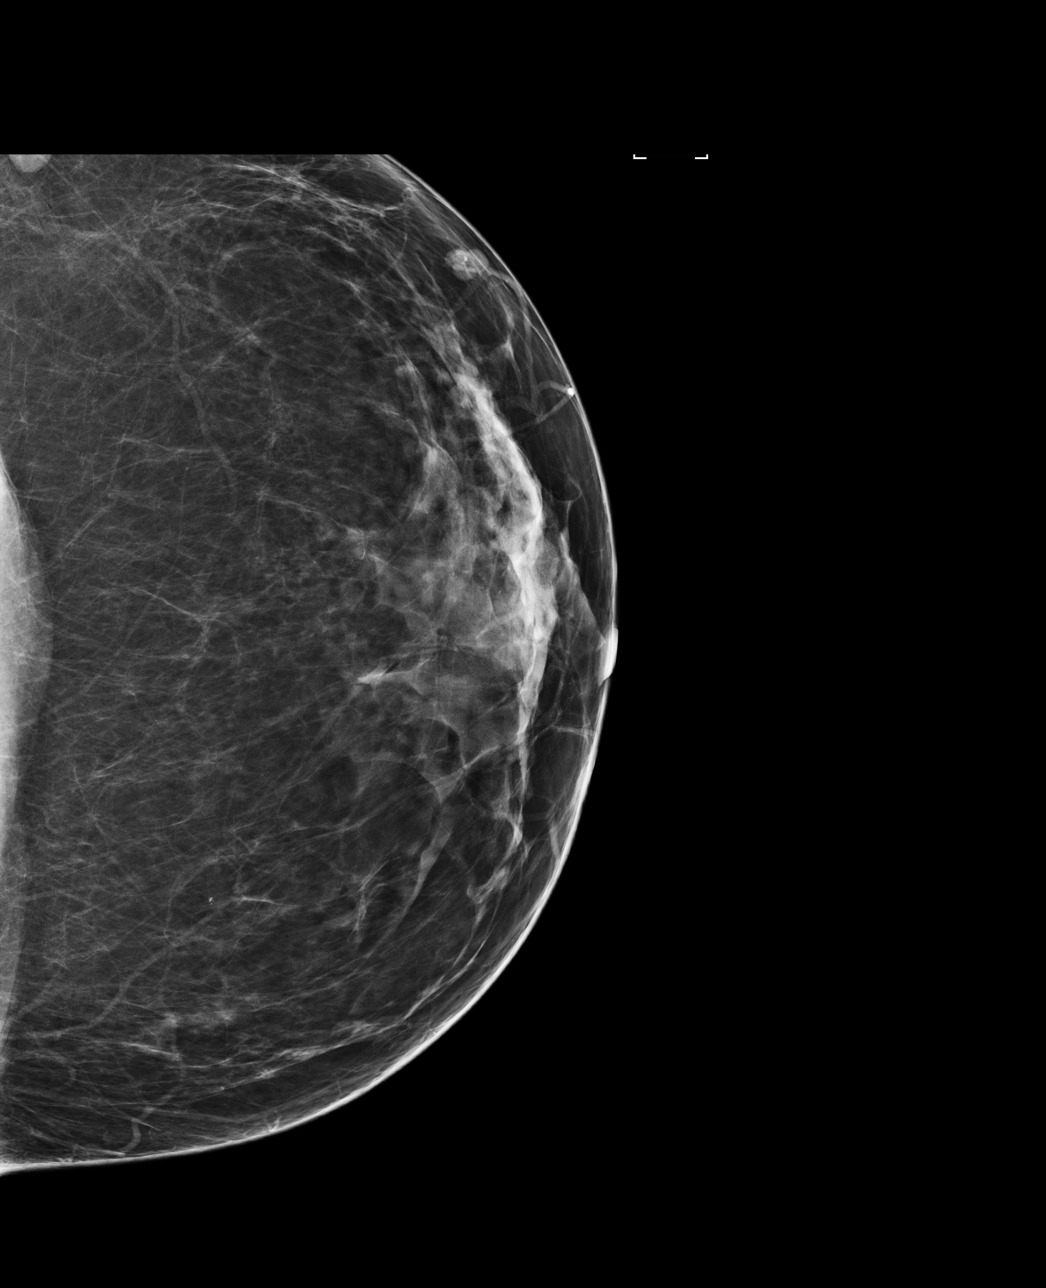

[L MLO]
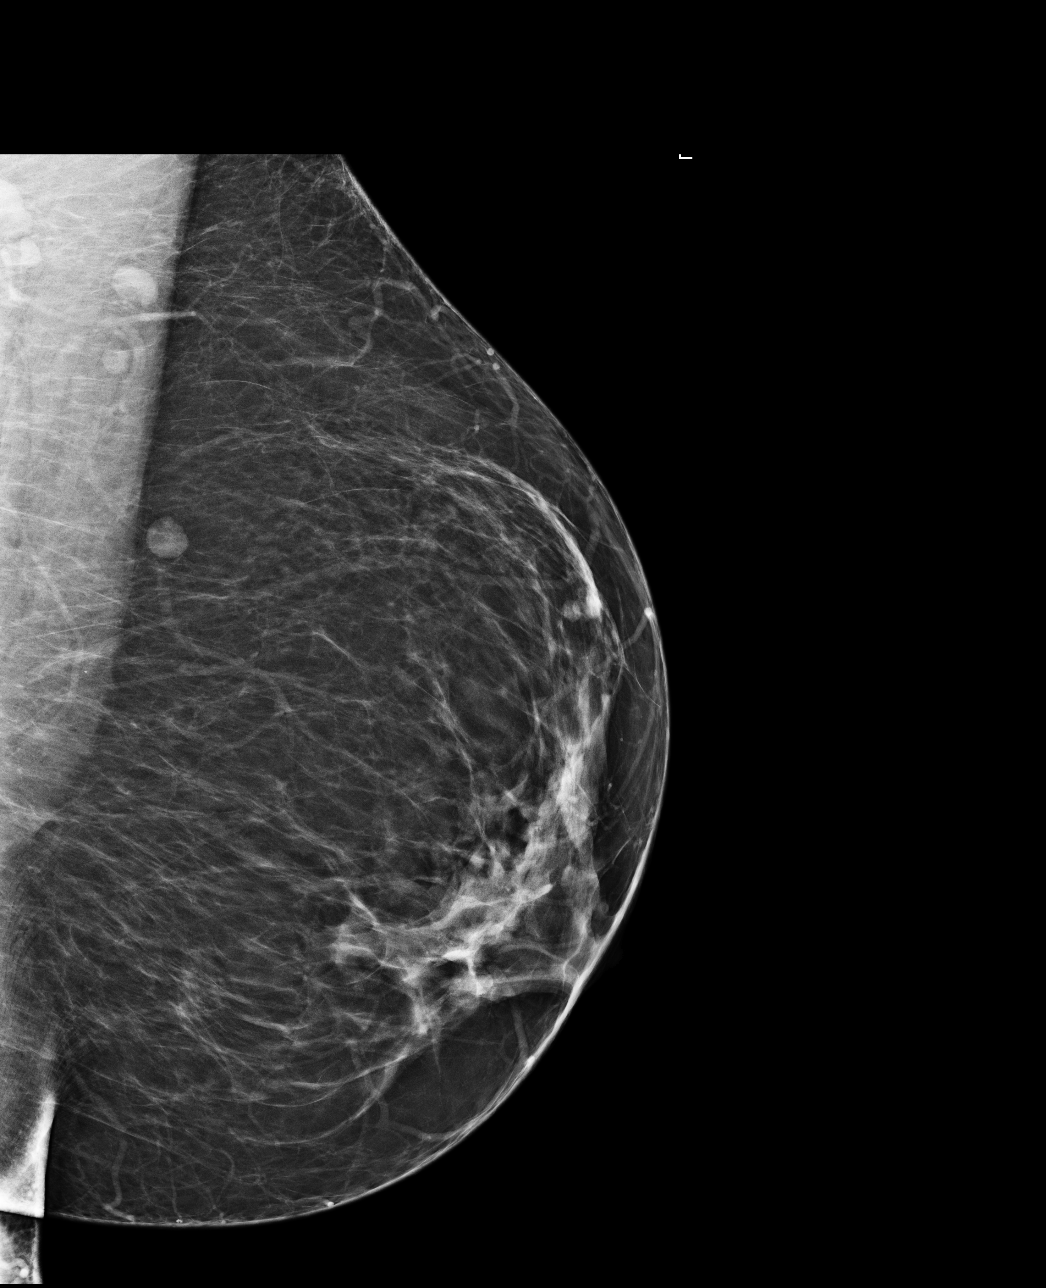

[4 of 4 positions shown; findings below may reference images not displayed]

ACR Breast Density Category b: There are scattered areas of
fibroglandular density.
FINDINGS: There are no findings suspicious for malignancy. Images were
processed with CAD.
IMPRESSION: No mammographic evidence of malignancy. A result letter of this
screening mammogram will be mailed directly to the patient.

RECOMMENDATION:
Screening mammogram in one year. (Code:AS-G-LCT)

BI-RADS CATEGORY  1: Negative.

## 2018-03-03 ENCOUNTER — Other Ambulatory Visit: Payer: Self-pay | Admitting: Internal Medicine

## 2018-03-03 DIAGNOSIS — Z1231 Encounter for screening mammogram for malignant neoplasm of breast: Secondary | ICD-10-CM

## 2018-04-14 ENCOUNTER — Ambulatory Visit: Payer: BLUE CROSS/BLUE SHIELD

## 2024-01-07 ENCOUNTER — Emergency Department (HOSPITAL_BASED_OUTPATIENT_CLINIC_OR_DEPARTMENT_OTHER)
Admission: EM | Admit: 2024-01-07 | Discharge: 2024-01-07 | Disposition: A | Discharge status: Home / Self Care | Payer: Self-pay | Attending: Emergency Medicine | Admitting: Emergency Medicine

## 2024-01-07 ENCOUNTER — Other Ambulatory Visit: Payer: Self-pay

## 2024-01-07 ENCOUNTER — Encounter (HOSPITAL_BASED_OUTPATIENT_CLINIC_OR_DEPARTMENT_OTHER): Payer: Self-pay

## 2024-01-07 DIAGNOSIS — T63481A Toxic effect of venom of other arthropod, accidental (unintentional), initial encounter: Secondary | ICD-10-CM | POA: Insufficient documentation

## 2024-01-07 MED ORDER — PREDNISONE 20 MG PO TABS
40.0000 mg | ORAL_TABLET | Freq: Once | ORAL | Status: AC
  Administered 2024-01-07: 40 mg via ORAL
  Filled 2024-01-07: qty 2

## 2024-01-07 MED ORDER — PREDNISONE 20 MG PO TABS: 40.0000 mg | ORAL_TABLET | Freq: Every day | ORAL | 0 refills | Status: AC

## 2024-01-07 MED ORDER — LORATADINE 10 MG PO TABS
10.0000 mg | ORAL_TABLET | Freq: Once | ORAL | Status: AC
  Administered 2024-01-07: 10 mg via ORAL
  Filled 2024-01-07: qty 1

## 2024-01-07 MED ORDER — FAMOTIDINE 20 MG PO TABS
20.0000 mg | ORAL_TABLET | Freq: Once | ORAL | Status: AC
  Administered 2024-01-07: 20 mg via ORAL
  Filled 2024-01-07: qty 1

## 2024-01-07 MED ORDER — CETIRIZINE HCL 10 MG PO TABS: 10.0000 mg | ORAL_TABLET | Freq: Every day | ORAL | 0 refills | Status: AC | PRN

## 2024-01-07 NOTE — ED Provider Notes (Signed)
 [place] [facility] Provider Note   CSN: [id number] Arrival date & time: 01/07/24  9041     Patient presents with: Pruritis   Alexis Gonzalez is a 53 y.o. female.   HPI   53 year old female presents to the emergency department complaints of an insect sting.  States that she is a [employer] they were driving with the windows down fragments that stung her on the lower back when she was leaning forward.  States that she remove the stinger and has a picture of it on her phone.  After that, had some pain as well as itching sensation where the insect directly stung her.  Yesterday developed itching on her arms and legs and face.  This is responded to Benadryl but she is only been taking this at night due to feelings of drowsiness and wanting to be alert whenever she is driving.  Denies any difficulty breathing, feelings of throat closing on her, abdominal pain, nausea, vomiting.  States that she has never been stung by an insect and has never had reaction like this before.  Presents to the emergency department for further assessment regarding her symptoms.  Past medical history significant for congenital adrenal hyperplasia, peptic ulcer disease  Prior to Admission medications   Medication Sig Start Date End Date Taking? Authorizing Provider  cetirizine  (ZYRTEC ) 10 MG tablet Take 1 tablet (10 mg total) by mouth daily as needed for allergies. 01/07/24  Yes Silver Fell A, PA  predniSONE  (DELTASONE ) 20 MG tablet Take 2 tablets (40 mg total) by mouth daily with breakfast for 4 days. 01/08/24 01/12/24 Yes Silver Fell A, PA  Cranberry 1000 MG CAPS Take by mouth.    [provider]  Cyanocobalamin (VITAMIN B 12 PO) Take by mouth.    [provider]  ibuprofen  (ADVIL ,MOTRIN ) 600 MG tablet Take 1 tablet (600 mg total) by mouth every 6 (six) hours as needed. 03/26/16   Ward, Ami Copes, PA-C  methocarbamol  (ROBAXIN ) 500 MG tablet Take 1  tablet (500 mg total) by mouth 2 (two) times daily as needed for muscle spasms. 03/26/16   Ward, Ami Copes, PA-C  Multiple Vitamin (MULTIVITAMIN) capsule Take 1 capsule by mouth daily.    [provider]  omeprazole (PRILOSEC) 20 MG capsule Take 20 mg by mouth 2 (two) times daily before a meal.    [provider]  predniSONE  (STERAPRED UNI-PAK) 5 MG TABS tablet Take by mouth 2 (two) times daily.    [provider]  vitamin C (ASCORBIC ACID) 250 MG tablet Take 250 mg by mouth daily.    [provider]  VITAMIN D, ERGOCALCIFEROL, PO Take by mouth.    [provider]    Allergies: Sulfa antibiotics    Review of Systems  All other systems reviewed and are negative.   Updated Vital Signs BP 132/88 (BP Location: Left Arm)   Pulse (!) 106   Temp 98.4 F (36.9 C) (Oral)   Resp 16   Ht 5' 3 (1.6 m)   Wt 104.3 kg   SpO2 98%   BMI 40.74 kg/m   Physical Exam Vitals and nursing note reviewed.  Constitutional:      General: She is not in acute distress.    Appearance: She is well-developed.  HENT:     Head: Normocephalic and atraumatic.  Eyes:     Conjunctiva/sclera: Conjunctivae normal.  Cardiovascular:     Rate and Rhythm: Normal rate and regular rhythm.  Heart sounds: No murmur heard. Pulmonary:     Effort: Pulmonary effort is normal. No respiratory distress.     Breath sounds: Normal breath sounds. No stridor. No wheezing, rhonchi or rales.  Abdominal:     Palpations: Abdomen is soft.     Tenderness: There is no abdominal tenderness.  Musculoskeletal:        General: No swelling.     Cervical back: Neck supple.  Skin:    General: Skin is warm and dry.     Capillary Refill: Capillary refill takes less than 2 seconds.     Comments: Punctate wound appreciated just right of midline low back with mild surrounding swelling/localized erythema.  Area slightly tender to the touch.  No obvious rash extending from this area.  Minimal  maculopapular rash appreciated bilateral forearms as well as cheeks of face.  Neurological:     Mental Status: She is alert.  Psychiatric:        Mood and Affect: Mood normal.     (all labs ordered are listed, but only abnormal results are displayed) Labs Reviewed - No data to display  EKG: None  Radiology: No results found.   Procedures   Medications Ordered in the ED  loratadine  (CLARITIN ) tablet 10 mg (has no administration in time range)  predniSONE  (DELTASONE ) tablet 40 mg (has no administration in time range)  famotidine  (PEPCID ) tablet 20 mg (has no administration in time range)                                    Medical Decision Making Risk OTC drugs. Prescription drug management.   This patient presents to the ED for concern of insect sting, this involves an extensive number of treatment options, and is a complaint that carries with it a high risk of complications and morbidity.  The differential diagnosis includes angioedema, anaphylaxis, urticaria, allergic reaction, other   Co morbidities that complicate the patient evaluation  See HPI   Additional history obtained:  Additional history obtained from EMR External records from outside source obtained and reviewed including hospital records   Lab Tests:  N/a   Imaging Studies ordered:  N/a   Cardiac Monitoring: / EKG:  N/a   Consultations Obtained:  N/a   Problem List / ED Course / Critical interventions / Medication management  Insect sting I ordered medication including Claritin , prednisone , Pepcid   Reevaluation of the patient after these medicines showed that the patient improved I have reviewed the patients home medicines and have made adjustments as needed   Social Determinants of Health:  Denies tobacco, licit drug use.   Test / Admission - Considered:  Insect sting Vitals signs within normal range and stable throughout visit.  53 year old female presents to the  emergency department complaints of an insect sting.  States that she is a [employer] they were driving with the windows down fragments that stung her on the lower back when she was leaning forward.  States that she remove the stinger and has a picture of it on her phone.  After that, had some pain as well as itching sensation where the insect directly stung her.  Yesterday developed itching on her arms and legs and face.  This is responded to Benadryl but she is only been taking this at night due to feelings of drowsiness and wanting to be alert whenever she is driving.  Denies any difficulty breathing, feelings  of throat closing on her, abdominal pain, nausea, vomiting.  States that she has never been stung by an insect and has never had reaction like this before.  Presents to the emergency department for further assessment regarding her symptoms. On exam, abdomen nontender.  Lungs clear to auscultation bilaterally.  No auscultatory stridor.  Localized swelling where insect sting was felt to occur right of midline low back without extending rash.  Mild rash appreciated patient's bilateral forearm, bilateral cheeks of face as above.  No clinical evidence of angioedema/anaphylaxis.  Patient has noted improvement with antihistamines but has been still using them at night due to Benadryl's drowsy side effect.  Will use Claritin /Allegra/Zyrtec  during the day, short course of appropriate steroid given multiple sites of rash present.  Will also send an EpiPen for any development of symptoms concerning for angioedema/anaphylaxis.  Will recommend follow-up with primary care for reassessment.  Treatment plan discussed with patient treatment understanding was agreeable.  Patient well-appearing, afebrile in no acute distress upon discharge. Worrisome signs and symptoms were discussed with the patient, and the patient acknowledged understanding to return to the ED if noticed. Patient was stable upon discharge.        Final diagnoses:  Insect stings, accidental or unintentional, initial encounter          Silver Wonda LABOR, [place] 01/07/24 1030    Cottie Donnice PARAS, MD 01/07/24 1229

## 2024-01-07 NOTE — Discharge Instructions (Addendum)
 As discussed, we will send in a nondrowsy antihistamine called Zyrtec  or  Cetirizine  into your pharmacy.  This medicine can be found over-the-counter and may be cheaper that way.  We will send you home with a few days worth of prednisone  as well to help with your symptoms.  Recommend follow-up with primary care for reassessment.  Please do not hesitate to return if the worrisome signs and symptoms discussed become apparent.

## 2024-01-07 NOTE — ED Triage Notes (Signed)
 Was stung to lower back on Sunday, c/o generalized itching since. No difficulty breathing/swallowing. No visible hives.
